# Patient Record
Sex: Female | Born: 1965 | Hispanic: Yes | Marital: Married | Smoking: Never smoker
Health system: Southern US, Community
[De-identification: ages and names within clinical notes are randomized; demographics above are authoritative.]

---

## 2015-08-06 ENCOUNTER — Ambulatory Visit: Payer: Self-pay

## 2015-08-15 ENCOUNTER — Ambulatory Visit: Payer: Self-pay | Attending: Oncology

## 2015-08-15 ENCOUNTER — Ambulatory Visit
Admission: RE | Admit: 2015-08-15 | Discharge: 2015-08-15 | Disposition: A | Discharge status: Home / Self Care | Payer: Self-pay | Source: Ambulatory Visit | Attending: Oncology | Admitting: Oncology

## 2015-08-15 VITALS — BP 132/85 | HR 64 | Temp 98.7°F | Resp 18 | Ht 65.35 in | Wt 174.8 lb

## 2015-08-15 DIAGNOSIS — Z0000 Encounter for general adult medical examination without abnormal findings: Secondary | ICD-10-CM

## 2015-08-15 NOTE — Progress Notes (Signed)
Subjective:     Patient ID: Jill Kline, female   DOB: November 26, 1965, 49 y.o.   MRN: [id number]  HPI   Review of Systems     Objective:   Physical Exam     Assessment:         Plan:          [facility] normal [facility] results.  Patient to return in one year for annual screening.Copy to [facility].

## 2015-08-15 NOTE — Progress Notes (Signed)
Subjective:     Patient ID: Vidal SchwalbeMaria Sanchez Guevara, female   DOB: 06/27/1966, 49 y.o.   MRN: [id number]  HPI   Review of Systems     Objective:   Physical Exam  Pulmonary/Chest: Right breast exhibits no inverted nipple, no mass, no nipple discharge, no skin change and no tenderness. Left breast exhibits no inverted nipple, no mass, no nipple discharge, no skin change and no tenderness. Breasts are symmetrical.       Assessment:     49 year old hispanic patient presents for [facility] clinic visitPatient screened, and meets [facility] eligibility.  Patient does not have insurance, Medicare or Medicaid.  Handout given on Affordable Care Act.CBE unremarkable.  Instructed patient on breast self-exam using teach back method.  Patient has muscular chest pain. PCP following.  Maritza Afanador interpreted exam.    Plan:     Sent for bilateral screening mammogram.

## 2017-11-25 ENCOUNTER — Ambulatory Visit: Payer: Self-pay | Attending: Oncology | Admitting: *Deleted

## 2017-11-25 ENCOUNTER — Encounter: Payer: Self-pay | Admitting: *Deleted

## 2017-11-25 ENCOUNTER — Ambulatory Visit
Admission: RE | Admit: 2017-11-25 | Discharge: 2017-11-25 | Disposition: A | Discharge status: Home / Self Care | Payer: Self-pay | Source: Ambulatory Visit | Attending: Oncology | Admitting: Oncology

## 2017-11-25 VITALS — BP 109/72 | HR 60 | Temp 97.0°F | Ht 64.0 in | Wt 170.0 lb

## 2017-11-25 DIAGNOSIS — Z0000 Encounter for general adult medical examination without abnormal findings: Secondary | ICD-10-CM

## 2017-11-25 NOTE — Patient Instructions (Signed)
Gave patient hand-out, Women Staying Healthy, Active and Well from [facility], with education on breast health, pap smears, heart and colon health. 

## 2017-11-25 NOTE — Progress Notes (Signed)
Letter mailed from the [facility] to inform patient of her normal mammogram results.  Patient is to follow-up with annual screening in one year.  [facility] to [facility]. 

## 2017-11-25 NOTE — Progress Notes (Signed)
Subjective:     Patient ID: Jill Kline, female   DOB: 09/04/1966, 52 y.o.   MRN: [id number]  HPI   Review of Systems     Objective:   Physical Exam  Pulmonary/Chest: Right breast exhibits inverted nipple. Right breast exhibits no mass, no nipple discharge, no skin change and no tenderness. Left breast exhibits no inverted nipple, no mass, no nipple discharge, no skin change and no tenderness. Breasts are symmetrical.    Right nipple inverted for 25 years per patient       Assessment:     52 year old Hispanic [facility]  Lloyda, the interpreter present during the interview and exam.  On clinical breast exam the right nipple is inverted, which the patient states has been present for 25 years.  Taught self breast awareness.  Last pap 04/25/16 was HPV negative  ASCUS.  Next pap due in 2020.  Patient has been screened for eligibility.  She does not have any insurance, Medicare or Medicaid.  She also meets financial eligibility.  Hand-out given on the Affordable Care Act.    Plan:     Screening mammogram ordered.  Will follow-up per [facility] protocol.

## 2018-07-21 DIAGNOSIS — R9439 Abnormal result of other cardiovascular function study: Secondary | ICD-10-CM | POA: Insufficient documentation

## 2018-12-20 ENCOUNTER — Ambulatory Visit: Payer: Self-pay | Attending: Oncology

## 2021-10-23 ENCOUNTER — Other Ambulatory Visit: Payer: Self-pay

## 2021-10-23 DIAGNOSIS — Z1231 Encounter for screening mammogram for malignant neoplasm of breast: Secondary | ICD-10-CM

## 2021-10-30 ENCOUNTER — Other Ambulatory Visit (HOSPITAL_COMMUNITY)
Admission: RE | Admit: 2021-10-30 | Discharge: 2021-10-30 | Disposition: A | Discharge status: Home / Self Care | Payer: Self-pay | Source: Ambulatory Visit | Attending: Obstetrics and Gynecology | Admitting: Obstetrics and Gynecology

## 2021-10-30 ENCOUNTER — Other Ambulatory Visit: Payer: Self-pay

## 2021-10-30 ENCOUNTER — Ambulatory Visit: Payer: Self-pay | Attending: Oncology | Admitting: *Deleted

## 2021-10-30 ENCOUNTER — Ambulatory Visit
Admission: RE | Admit: 2021-10-30 | Discharge: 2021-10-30 | Disposition: A | Discharge status: Home / Self Care | Payer: Self-pay | Source: Ambulatory Visit | Attending: Obstetrics and Gynecology | Admitting: Obstetrics and Gynecology

## 2021-10-30 VITALS — BP 112/77 | Temp 98.2°F | Resp 18 | Wt 177.0 lb

## 2021-10-30 DIAGNOSIS — Z01419 Encounter for gynecological examination (general) (routine) without abnormal findings: Secondary | ICD-10-CM

## 2021-10-30 DIAGNOSIS — Z1231 Encounter for screening mammogram for malignant neoplasm of breast: Secondary | ICD-10-CM | POA: Insufficient documentation

## 2021-10-30 NOTE — Progress Notes (Addendum)
Ms. Jill Kline is a 56 y.o. No obstetric history on file. female who presents to [facility] clinic today with no complaints.    Pap Smear: Pap smear completed today. Last Pap smear was 04/25/2016 at [facility] clinic and was abnormal - ASCUS with negative HPV . Per patient has no history of an abnormal Pap smear prior to her most recent Pap smear. Last Pap smear result is available in Epic.   Physical exam: Breasts Left breast slightly larger than right breast. No skin abnormalities bilateral breasts. No nipple retraction left breast. Right nipple inverted that per patient is normal for her. No nipple discharge bilateral breasts. No lymphadenopathy. No lumps palpated bilateral breasts. No complaints of pain or tenderness on exam.     MS DIGITAL SCREENING TOMO BILATERAL  Result Date: 11/25/2017 CLINICAL DATA:  Screening. EXAM: DIGITAL SCREENING BILATERAL MAMMOGRAM WITH TOMO AND CAD COMPARISON:  Previous exam(s). ACR Breast Density Category c: The breast tissue is heterogeneously dense, which may obscure small masses. FINDINGS: There are no findings suspicious for malignancy. Images were processed with CAD. IMPRESSION: No mammographic evidence of malignancy. A result letter of this screening mammogram will be mailed directly to the patient. RECOMMENDATION: Screening mammogram in one year. (Code:SM-B-01Y) BI-RADS CATEGORY  1: Negative. Electronically Signed   By: Jill Kline M.D.   On: 11/25/2017 12:50    Pelvic/Bimanual Ext Genitalia No lesions, no swelling and no discharge observed on external genitalia.        Vagina Vagina pink and normal texture. No lesions or discharge observed in vagina.        Cervix Cervix is present. Cervix pink and of normal texture. Cervix friable. No discharge observed.    Uterus Uterus is present and palpable. Uterus in normal position and normal size.        Adnexae Bilateral ovaries present and palpable. No tenderness on  palpation.         Rectovaginal No rectal exam completed today since patient had no rectal complaints. No skin abnormalities observed on exam.     Smoking History: Patient has never smoked.   Patient Navigation: Patient education provided. Access to services provided for patient through [facility] program. Spanish interpreter Jill Kline [phone] on [place] provided.   Colorectal Cancer Screening: Per patient has never had colonoscopy completed. No complaints today.    Breast and Cervical Cancer Risk Assessment: Patient does not have family history of breast cancer, known genetic mutations, or radiation treatment to the chest before age 79. Patient does not have history of cervical dysplasia, immunocompromised, or DES exposure in-utero.  Risk Assessment     Risk Scores       10/30/2021   Last edited by: Jill Chris, RN   5-year risk: 0.5 %   Lifetime risk: 3.7 %           A: [facility] exam with pap smear No complaints.  P: Referred patient to the [facility] for a screening mammogram. Appointment scheduled Wednesday, October 30, 2021 at 1440.  Jill Heidelberg, RN 10/30/2021 1:06 PM

## 2021-10-30 NOTE — Patient Instructions (Signed)
Explained breast self awareness with Vidal Schwalbe. Pap smear completed today. Let her know [facility] will cover Pap smears and HPV typing every 5 years unless has a history of abnormal Pap smears. Referred patient to the [facility] for a screening mammogram. Appointment scheduled Wednesday, October 30, 2021 at 1440. Patient aware of appointment and will be there. Let patient know will follow up with her within the next couple weeks with results of Pap smear by phone. Informed patient that the Breast Center will follow-up with her within the next couple of weeks with results of her mammogram by letter or phone. Vidal Schwalbe verbalized understanding.  Caswell Alvillar, Kathaleen Maser, RN 1:06 PM

## 2021-10-31 LAB — CYTOLOGY - PAP
Comment: NEGATIVE
Diagnosis: NEGATIVE
High risk HPV: NEGATIVE

## 2021-11-06 ENCOUNTER — Telehealth: Payer: Self-pay

## 2021-11-06 NOTE — Telephone Encounter (Signed)
Via Delorise Royals, Spanish Interpreter [facility]), Patient informed negative Pap/HPV results, repeat pap in 3 years. Patient verbalized understanding.

## 2023-10-27 IMAGING — MG MM DIGITAL SCREENING BILAT W/ TOMO AND CAD
8 series · 8 of 24 positions shown · non-contrast
Comparison: Previous exam(s).

CLINICAL DATA: Screening.

EXAM:
DIGITAL SCREENING BILATERAL MAMMOGRAM WITH TOMOSYNTHESIS AND CAD
TECHNIQUE: Bilateral screening digital craniocaudal and mediolateral oblique
mammograms were obtained. Bilateral screening digital breast
tomosynthesis was performed. The images were evaluated with
computer-aided detection.

[R CC synth-2D]
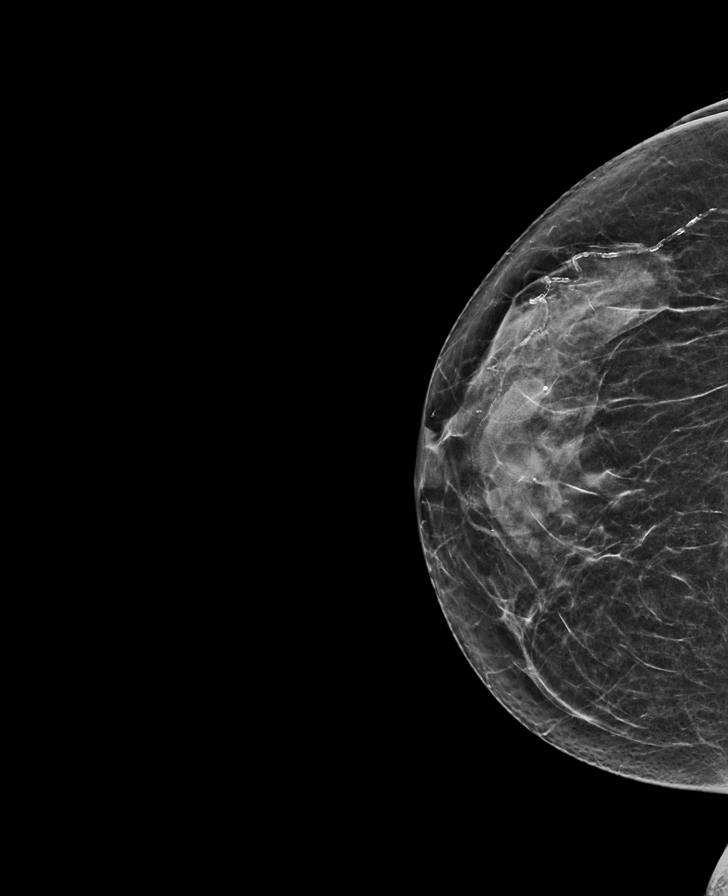

[L MLO synth-2D]
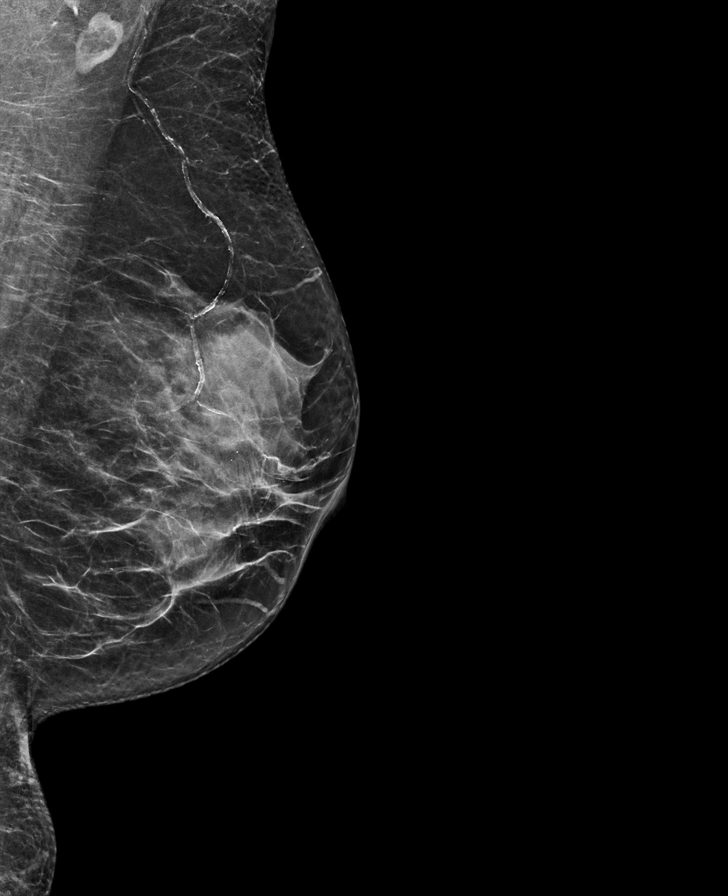

[R MLO synth-2D]
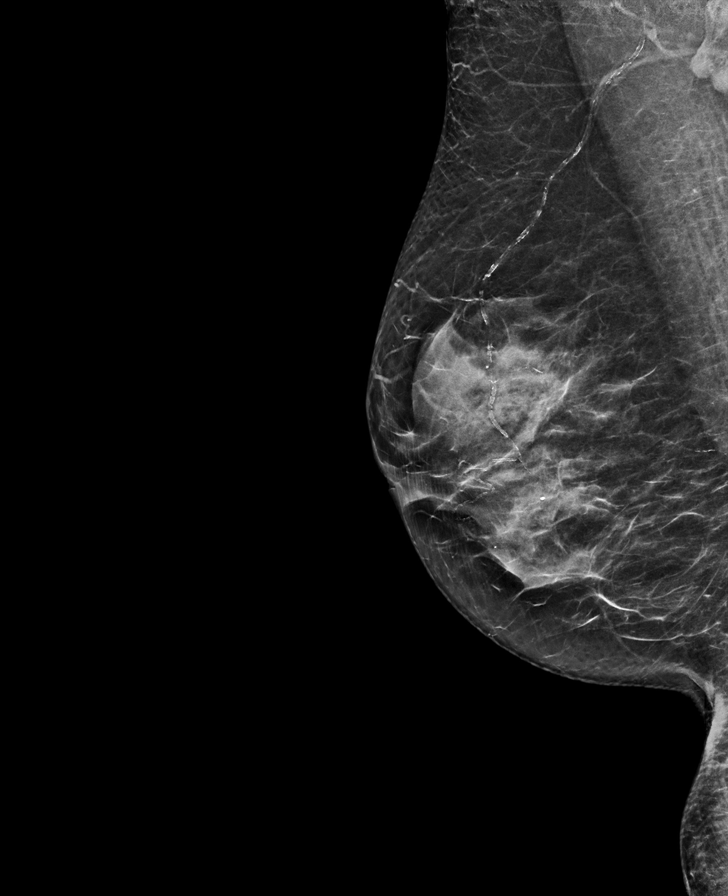

[L CC synth-2D]
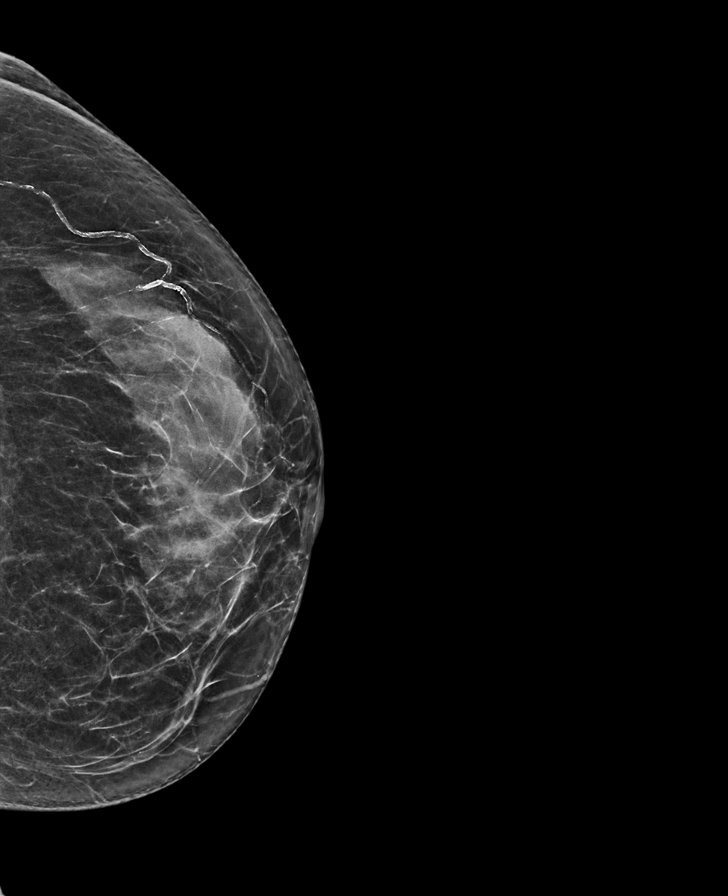

[R CC tomo · tomo slice 31/60.0]
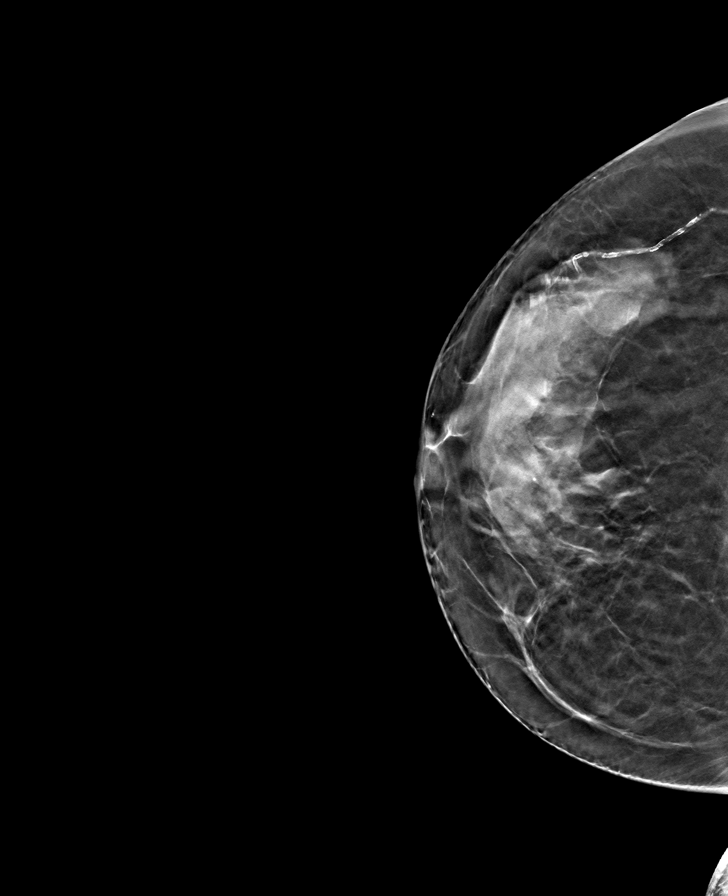

[L MLO tomo · tomo slice 33/66.0]
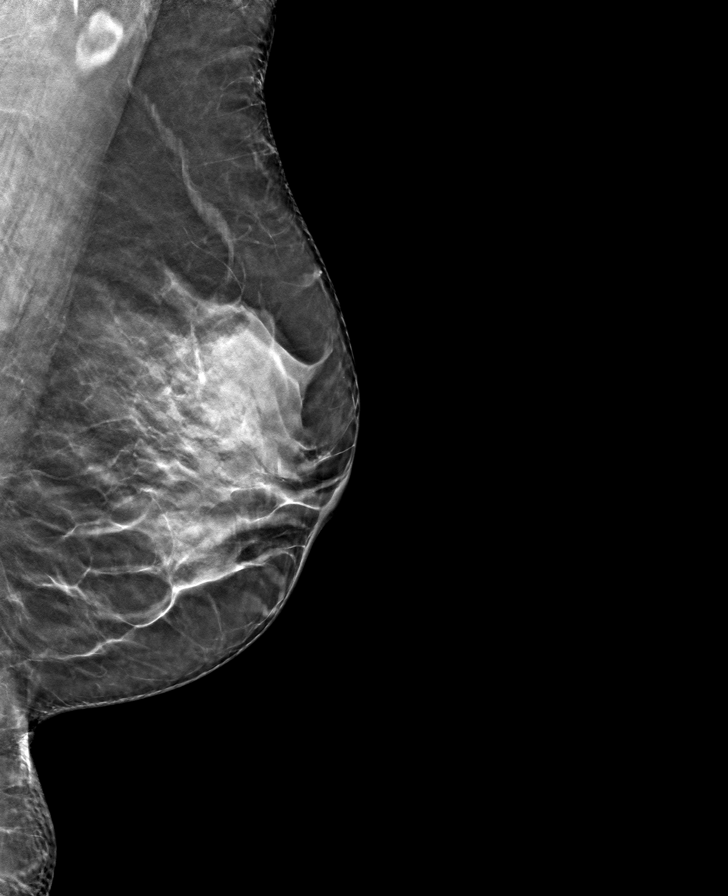

[R MLO tomo · tomo slice 33/64.0]
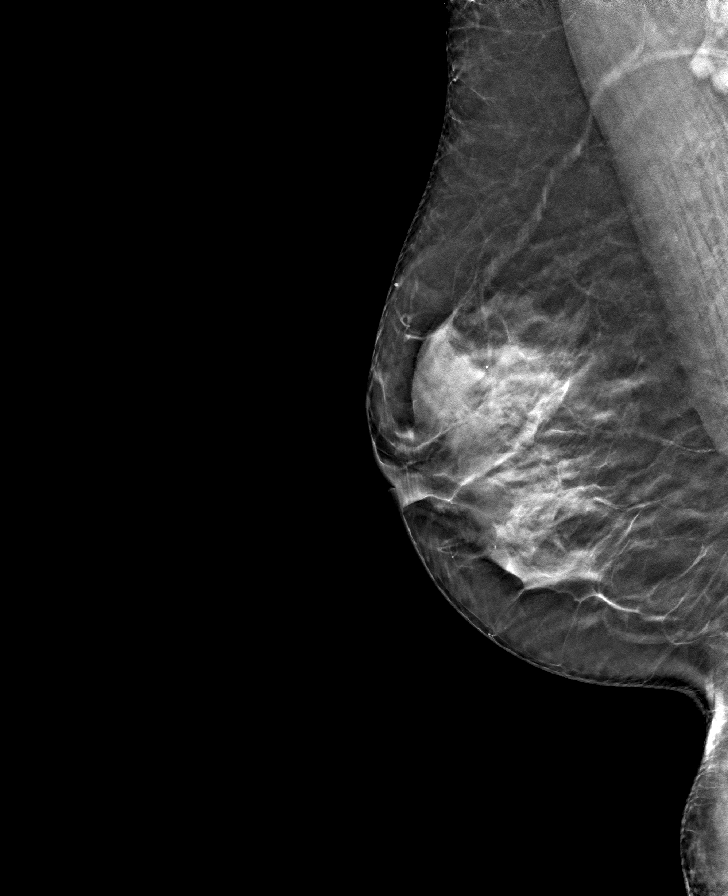

[L CC tomo · tomo slice 31/62.0]
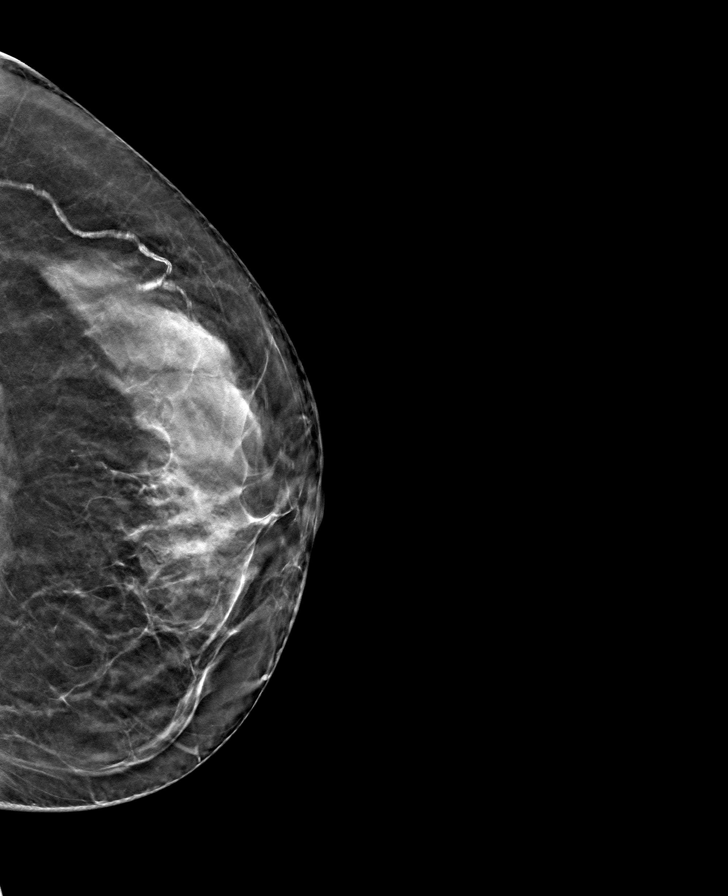

[8 of 24 positions shown; findings below may reference images not displayed]

ACR Breast Density Category c: The breast tissue is heterogeneously
dense, which may obscure small masses.
FINDINGS: There are no findings suspicious for malignancy.
IMPRESSION: No mammographic evidence of malignancy. A result letter of this
screening mammogram will be mailed directly to the patient.

RECOMMENDATION:
Screening mammogram in one year. (Code:Q3-W-BC3)

BI-RADS CATEGORY  1: Negative.
